# Patient Record
Sex: Female | Born: 1975 | Race: White | Hispanic: No | Marital: Married | Smoking: Never smoker
Health system: Southern US, Community
[De-identification: ages and names within clinical notes are randomized; demographics above are authoritative.]

## PROBLEM LIST (undated history)

## (undated) DIAGNOSIS — F419 Anxiety disorder, unspecified: Secondary | ICD-10-CM

## (undated) DIAGNOSIS — T7840XA Allergy, unspecified, initial encounter: Secondary | ICD-10-CM

## (undated) DIAGNOSIS — F329 Major depressive disorder, single episode, unspecified: Secondary | ICD-10-CM

## (undated) DIAGNOSIS — F32A Depression, unspecified: Secondary | ICD-10-CM

## (undated) HISTORY — DX: Anxiety disorder, unspecified: F41.9

## (undated) HISTORY — DX: Allergy, unspecified, initial encounter: T78.40XA

## (undated) HISTORY — DX: Depression, unspecified: F32.A

## (undated) HISTORY — DX: Major depressive disorder, single episode, unspecified: F32.9

---

## 2004-12-27 ENCOUNTER — Other Ambulatory Visit: Admission: RE | Admit: 2004-12-27 | Discharge: 2004-12-27 | Payer: Self-pay | Admitting: Family Medicine

## 2005-06-24 ENCOUNTER — Other Ambulatory Visit: Admission: RE | Admit: 2005-06-24 | Discharge: 2005-06-24 | Payer: Self-pay | Admitting: Family Medicine

## 2005-11-04 ENCOUNTER — Other Ambulatory Visit: Admission: RE | Admit: 2005-11-04 | Discharge: 2005-11-04 | Payer: Self-pay | Admitting: Obstetrics and Gynecology

## 2006-04-01 ENCOUNTER — Inpatient Hospital Stay (HOSPITAL_COMMUNITY): Admission: AD | Admit: 2006-04-01 | Discharge: 2006-04-01 | Payer: Self-pay | Admitting: Obstetrics and Gynecology

## 2006-04-05 ENCOUNTER — Inpatient Hospital Stay (HOSPITAL_COMMUNITY): Admission: AD | Admit: 2006-04-05 | Discharge: 2006-04-05 | Payer: Self-pay | Admitting: Obstetrics and Gynecology

## 2006-05-10 ENCOUNTER — Inpatient Hospital Stay (HOSPITAL_COMMUNITY): Admission: AD | Admit: 2006-05-10 | Discharge: 2006-05-13 | Payer: Self-pay | Admitting: Obstetrics and Gynecology

## 2007-02-23 IMAGING — US US OB COMP +14 WK
1 series · 14 of 28 positions shown · non-contrast
Comparison: None.

CLINICAL DATA: Maternal hypertension.
 TRANSVAGINAL OBSTETRICAL US:
TECHNIQUE: Transvaginal ultrasound was performed for evaluation of the gestation as well as the maternal uterus and adnexal regions.

[Series 1: us ob comp +14 wk · 0.35mm/px · 14 of 32 slices shown]
[im 2/32]
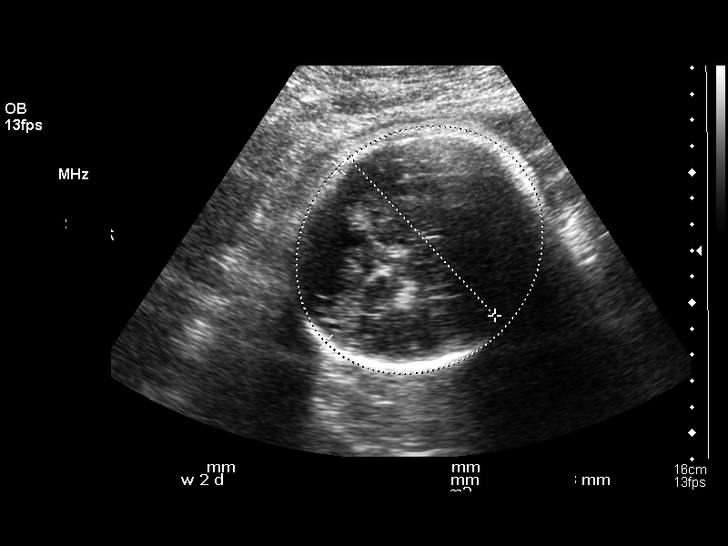
[im 4/32]
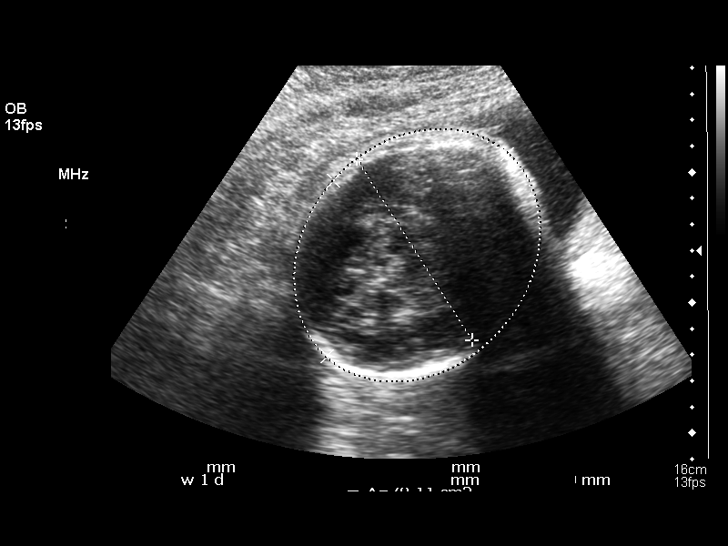
[im 6/32]
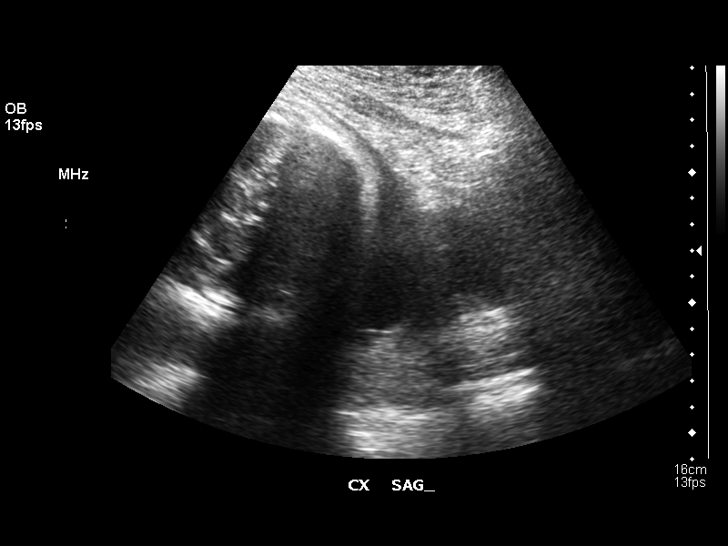
[im 9/32]
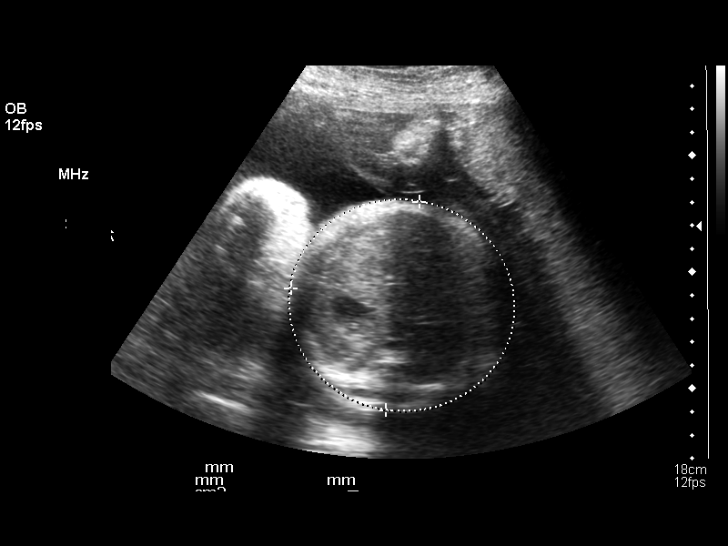
[im 11/32]
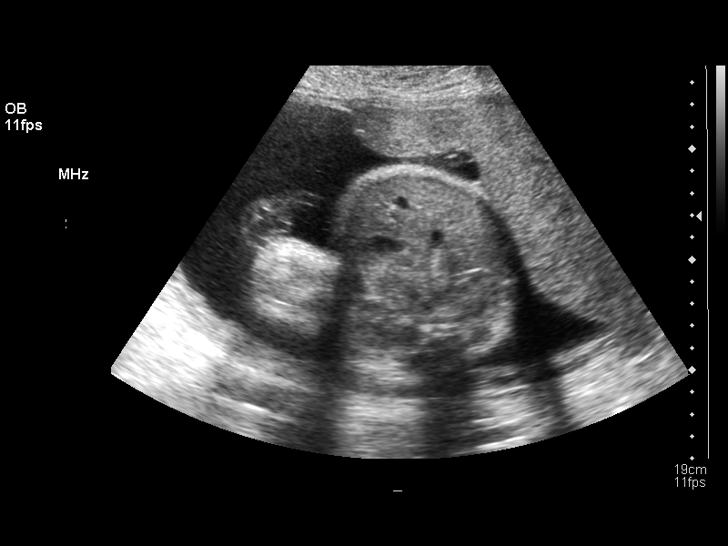
[im 13/32]
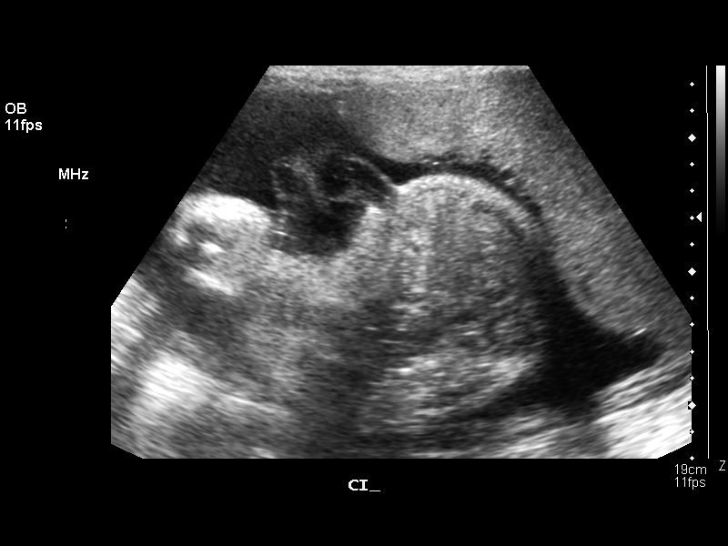
[im 15/32]
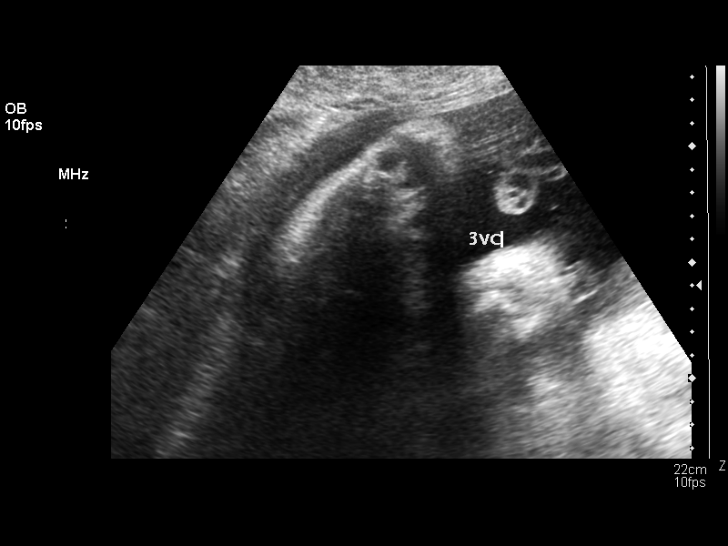
[im 18/32]
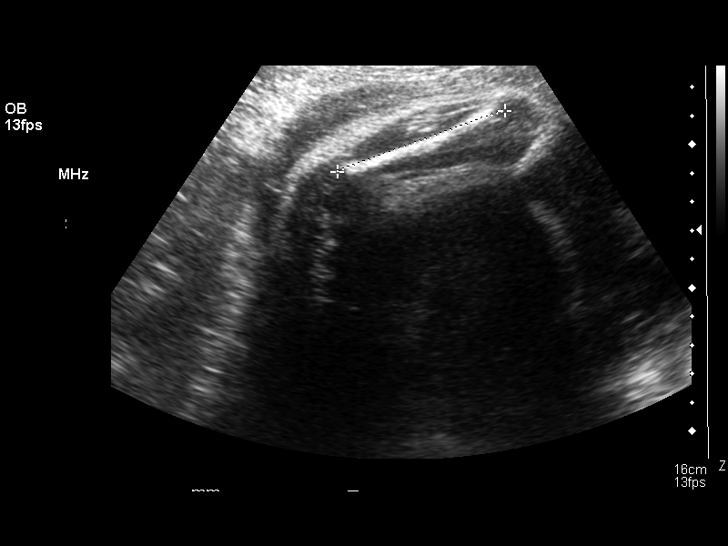
[im 20/32]
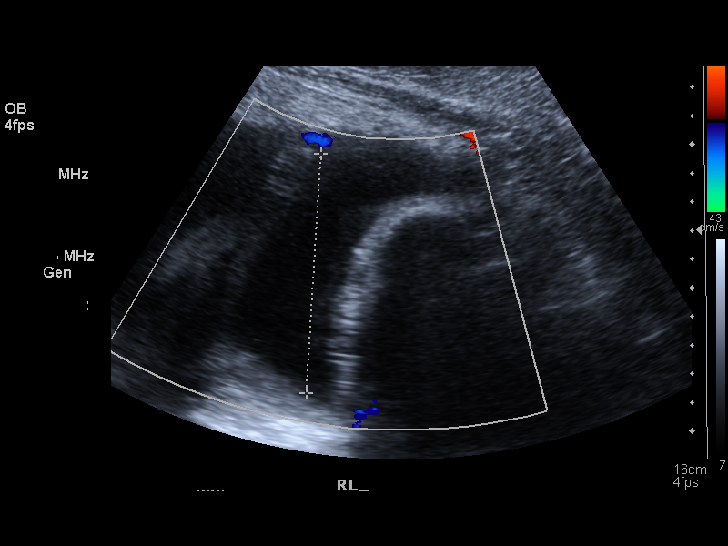
[im 22/32]
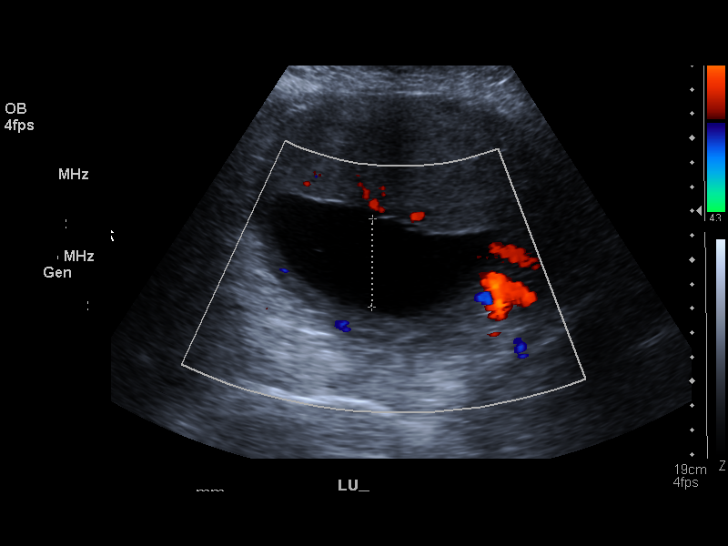
[im 25/32]
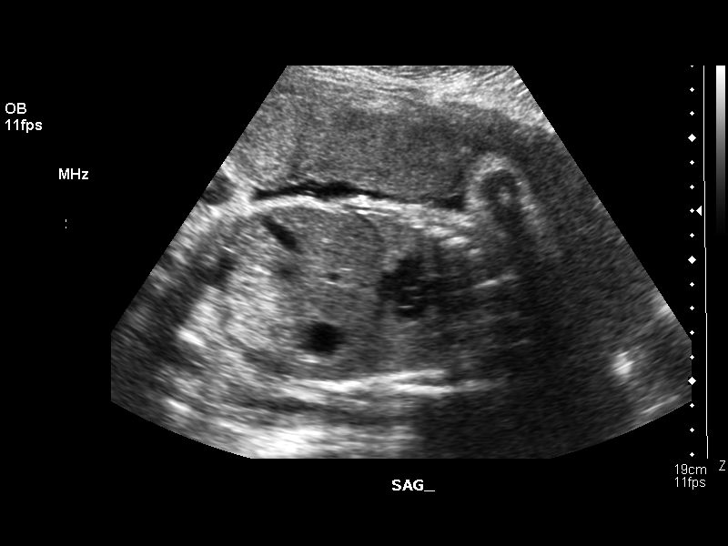
[im 27/32]
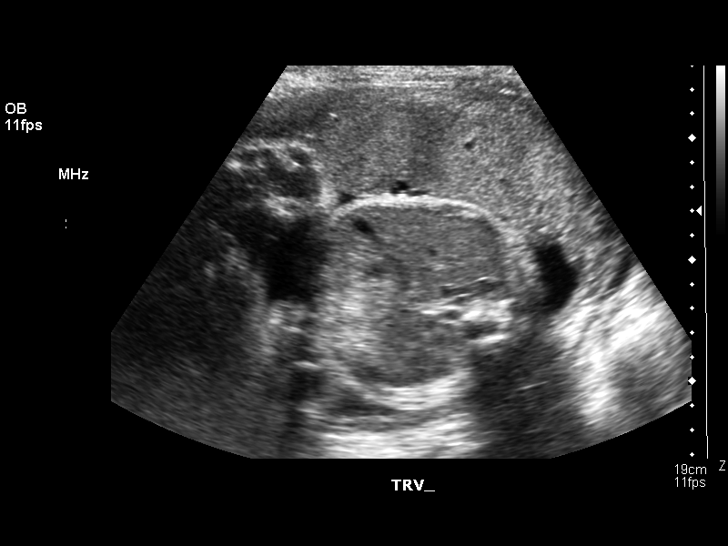
[im 29/32]
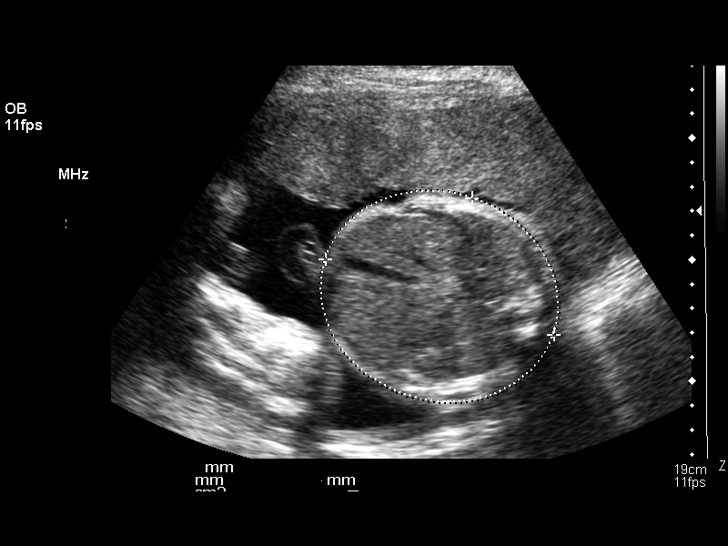
[im 32/32]
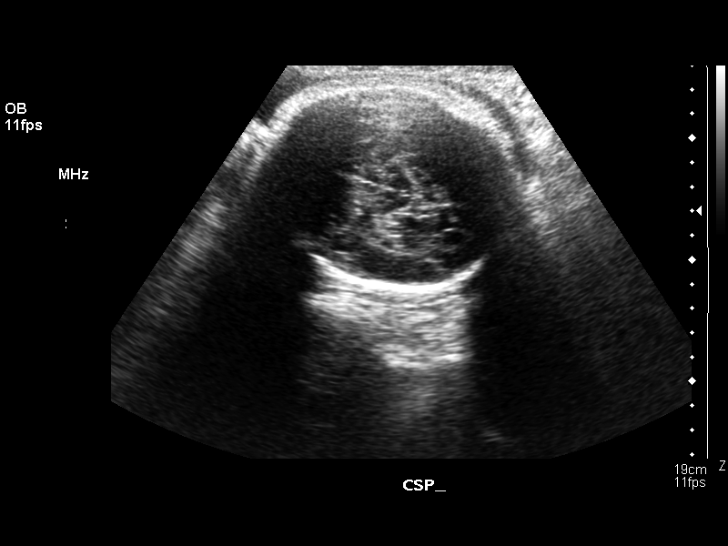

[14 of 28 positions shown; findings below may reference images not displayed]

FINDINGS: There is a single fetus which is currently cephalic in presentation.  The placenta is anterior and grade I.  There is no previa or abruption.  Amniotic fluid is subjectively normal with an index of 20 cm.  Gestational age is estimated at 33 weeks 0 days.  Fetal weight is estimated at 7361 grams.  Fetal assessment is limited by maternal body habitus.  There are no obvious anomalies.  See obstetrical worksheet for details.  The cervix measures 3.1 cm measured transabdominally.
IMPRESSION: 1.  Single, living, intrauterine fetus with estimated gestational age of 33 weeks 0 days.
 2.  Fetal assessment unremarkable given the limitations of the study. 
 3.  Placenta and amniotic fluid normal.

## 2011-10-19 ENCOUNTER — Ambulatory Visit: Payer: BC Managed Care – PPO | Admitting: Family Medicine

## 2011-10-19 VITALS — BP 143/80 | HR 97 | Temp 98.5°F | Resp 16

## 2011-10-19 DIAGNOSIS — J019 Acute sinusitis, unspecified: Secondary | ICD-10-CM

## 2011-10-19 MED ORDER — AMOXICILLIN 875 MG PO TABS: 875.0000 mg | ORAL_TABLET | Freq: Two times a day (BID) | ORAL | Status: AC

## 2011-10-19 NOTE — Progress Notes (Signed)
  Subjective:    Patient ID: Chelsea Wilkinson, female    DOB: [birth year], 36 y.o.   MRN: [id number]  HPI 36 yo female with URI symptoms for 4 days.  Sore throat, Tmax 100.6.  Head congestion/sinus pressure.  Lots of PND.  Sore throat is worst symptom.  Tender lymph nodes.  Did have strep throat last year but doesn't feel like that.    Review of Systems    Negative except as per HPI  Objective:   Physical Exam  Constitutional: She appears well-developed. No distress.  HENT:  Right Ear: Tympanic membrane, external ear and ear canal normal. Tympanic membrane is not injected, not scarred, not perforated, not erythematous, not retracted and not bulging.  Left Ear: Tympanic membrane, external ear and ear canal normal. Tympanic membrane is not injected, not scarred, not perforated, not erythematous, not retracted and not bulging.  Nose: Mucosal edema present. No rhinorrhea. Right sinus exhibits maxillary sinus tenderness. Right sinus exhibits no frontal sinus tenderness. Left sinus exhibits no maxillary sinus tenderness and no frontal sinus tenderness.  Mouth/Throat: Uvula is midline and mucous membranes are normal. Posterior oropharyngeal erythema present. No oropharyngeal exudate or tonsillar abscesses.  Cardiovascular: Normal rate, regular rhythm, normal heart sounds and intact distal pulses.   No murmur heard. Pulmonary/Chest: Effort normal and breath sounds normal. No respiratory distress. She has no wheezes. She has no rales.  Lymphadenopathy:       Head (right side): No submandibular and no preauricular adenopathy present.       Head (left side): No submandibular and no preauricular adenopathy present.       Right cervical: No superficial cervical and no posterior cervical adenopathy present.      Left cervical: No superficial cervical and no posterior cervical adenopathy present.       Right: No supraclavicular adenopathy present.       Left: No supraclavicular adenopathy present.  Skin:  Skin is warm and dry.          Assessment & Plan:  Sinusitis - Amox 875 BID

## 2011-11-05 ENCOUNTER — Telehealth: Payer: Self-pay

## 2011-11-05 NOTE — Telephone Encounter (Signed)
Patients husband request to pick up a copy of patients physical.

## 2011-11-05 NOTE — Telephone Encounter (Signed)
Printed out message due to patient's cpe is in her paper chart.

## 2013-02-23 ENCOUNTER — Ambulatory Visit (INDEPENDENT_AMBULATORY_CARE_PROVIDER_SITE_OTHER): Payer: BC Managed Care – PPO | Admitting: Physician Assistant

## 2013-02-23 VITALS — BP 118/82 | HR 98 | Temp 97.9°F | Resp 18 | Ht 63.0 in | Wt 231.6 lb

## 2013-02-23 DIAGNOSIS — R350 Frequency of micturition: Secondary | ICD-10-CM

## 2013-02-23 DIAGNOSIS — N390 Urinary tract infection, site not specified: Secondary | ICD-10-CM

## 2013-02-23 LAB — POCT UA - MICROSCOPIC ONLY
Casts, Ur, LPF, POC: NEGATIVE
Crystals, Ur, HPF, POC: NEGATIVE
RBC, urine, microscopic: NEGATIVE
Yeast, UA: NEGATIVE

## 2013-02-23 LAB — POCT URINALYSIS DIPSTICK
Ketones, UA: NEGATIVE
Nitrite, UA: NEGATIVE
Protein, UA: NEGATIVE
Urobilinogen, UA: 0.2
pH, UA: 5.5

## 2013-02-23 MED ORDER — NITROFURANTOIN MONOHYD MACRO 100 MG PO CAPS: 100.0000 mg | ORAL_CAPSULE | Freq: Two times a day (BID) | ORAL | Status: DC

## 2013-02-23 NOTE — Progress Notes (Signed)
Subjective:    Patient ID: Chelsea Wilkinson, female    DOB: 1976-01-22, 37 y.o.   MRN: [id number]  HPI   Ms. Delatte is a very pleasant 37 yr old female here with concern for urinary frequency and urgency.   Feels like she always use the bathroom frequently, but has been worse in last couple of days.  Denies dysuria or hematuria.  Notes "white flakes" in her urine - has never had this before and this is what prompted her to come in.  Has had a UTI in the past - thinks maybe 10-15 yrs ago.  At that time recalls having more pain and urgency.  Does endorse a little back pain today.  No abd pain, NV or FC.  Denies vaginal symptoms.  No concern for STIs.  LMP 02/04/13   Review of Systems  Constitutional: Negative for fever, chills and appetite change.  HENT: Negative.   Respiratory: Negative.   Cardiovascular: Negative.   Gastrointestinal: Negative for nausea, vomiting and abdominal pain.  Genitourinary: Positive for urgency and frequency. Negative for dysuria, hematuria, flank pain, vaginal discharge and menstrual problem.  Musculoskeletal: Positive for back pain.  Skin: Negative.   Neurological: Negative.        Objective:   Physical Exam  Vitals reviewed. Constitutional: She is oriented to person, place, and time. She appears well-developed and well-nourished. No distress.  HENT:  Head: Normocephalic and atraumatic.  Eyes: Conjunctivae are normal. No scleral icterus.  Cardiovascular: Normal rate, regular rhythm and normal heart sounds.   Pulmonary/Chest: Effort normal and breath sounds normal. She has no wheezes. She has no rales.  Abdominal: Soft. There is tenderness in the right lower quadrant, periumbilical area and suprapubic area. There is CVA tenderness (slight, right sided). There is no rigidity, no rebound and no guarding.  Neurological: She is alert and oriented to person, place, and time.  Skin: Skin is warm and dry.  Psychiatric: She has a normal mood and affect. Her behavior  is normal.     Results for orders placed in visit on 02/23/13  POCT UA - MICROSCOPIC ONLY      Result Value Range   WBC, Ur, HPF, POC 0-4     RBC, urine, microscopic neg     Bacteria, U Microscopic trace     Mucus, UA neg     Epithelial cells, urine per micros 0-6     Crystals, Ur, HPF, POC neg     Casts, Ur, LPF, POC neg     Yeast, UA neg    POCT URINALYSIS DIPSTICK      Result Value Range   Color, UA yellow     Clarity, UA clear     Glucose, UA neg     Bilirubin, UA neg     Ketones, UA neg     Spec Grav, UA 1.010     Blood, UA neg     pH, UA 5.5     Protein, UA neg     Urobilinogen, UA 0.2     Nitrite, UA neg     Leukocytes, UA small (1+)          Assessment & Plan:  UTI (urinary tract infection) - Plan: Urine culture, nitrofurantoin, macrocrystal-monohydrate, (MACROBID) 100 MG capsule  Frequency of urination - Plan: POCT UA - Microscopic Only, POCT urinalysis dipstick, Urine culture, nitrofurantoin, macrocrystal-monohydrate, (MACROBID) 100 MG capsule   Ms. Dziuba is a very pleasant 37 yr old female here with likely UTI.  UA  is not overly impressive but does have leuks and bacteria.  Will send culture to confirm.  Will start macrobid today.  Push fluids.  Discussed RTC precautions with pt who understands and is in agreement with this plan.  Will adjust abx if necessary based on cx data.

## 2013-02-23 NOTE — Patient Instructions (Addendum)
Begin taking the Macrobid (nitrofurantoin) as directed.  Drink plenty of fluids (water is best!)  I am sending your urine out for culture and will let you know what this shows and if we need to make any changes to treatment.  Please let us know if symptoms worsen or do not improve.   Urinary Tract Infection Urinary tract infections (UTIs) can develop anywhere along your urinary tract. Your urinary tract is your body's drainage system for removing wastes and extra water. Your urinary tract includes two kidneys, two ureters, a bladder, and a urethra. Your kidneys are a pair of bean-shaped organs. Each kidney is about the size of your fist. They are located below your ribs, one on each side of your spine. CAUSES Infections are caused by microbes, which are microscopic organisms, including fungi, viruses, and bacteria. These organisms are so small that they can only be seen through a microscope. Bacteria are the microbes that most commonly cause UTIs. SYMPTOMS  Symptoms of UTIs may vary by age and gender of the patient and by the location of the infection. Symptoms in young women typically include a frequent and intense urge to urinate and a painful, burning feeling in the bladder or urethra during urination. Older women and men are more likely to be tired, shaky, and weak and have muscle aches and abdominal pain. A fever may mean the infection is in your kidneys. Other symptoms of a kidney infection include pain in your back or sides below the ribs, nausea, and vomiting. DIAGNOSIS To diagnose a UTI, your caregiver will ask you about your symptoms. Your caregiver also will ask to provide a urine sample. The urine sample will be tested for bacteria and white blood cells. White blood cells are made by your body to help fight infection. TREATMENT  Typically, UTIs can be treated with medication. Because most UTIs are caused by a bacterial infection, they usually can be treated with the use of antibiotics. The  choice of antibiotic and length of treatment depend on your symptoms and the type of bacteria causing your infection. HOME CARE INSTRUCTIONS  If you were prescribed antibiotics, take them exactly as your caregiver instructs you. Finish the medication even if you feel better after you have only taken some of the medication.  Drink enough water and fluids to keep your urine clear or pale yellow.  Avoid caffeine, tea, and carbonated beverages. They tend to irritate your bladder.  Empty your bladder often. Avoid holding urine for long periods of time.  Empty your bladder before and after sexual intercourse.  After a bowel movement, women should cleanse from front to back. Use each tissue only once. SEEK MEDICAL CARE IF:   You have back pain.  You develop a fever.  Your symptoms do not begin to resolve within 3 days. SEEK IMMEDIATE MEDICAL CARE IF:   You have severe back pain or lower abdominal pain.  You develop chills.  You have nausea or vomiting.  You have continued burning or discomfort with urination. MAKE SURE YOU:   Understand these instructions.  Will watch your condition.  Will get help right away if you are not doing well or get worse. Document Released: 04/23/2005 Document Revised: 01/13/2012 Document Reviewed: 08/22/2011 [facility] Patient Information 2014 [place], [place].

## 2013-02-25 LAB — URINE CULTURE: Colony Count: 30000

## 2013-03-09 ENCOUNTER — Ambulatory Visit: Payer: 59 | Admitting: Family Medicine

## 2013-03-09 VITALS — BP 122/80 | HR 67 | Temp 98.0°F | Resp 16 | Ht 63.0 in | Wt 230.0 lb

## 2013-03-09 DIAGNOSIS — IMO0001 Reserved for inherently not codable concepts without codable children: Secondary | ICD-10-CM

## 2013-03-09 DIAGNOSIS — R350 Frequency of micturition: Secondary | ICD-10-CM

## 2013-03-09 LAB — POCT WET PREP WITH KOH
Clue Cells Wet Prep HPF POC: NEGATIVE
KOH Prep POC: NEGATIVE
RBC Wet Prep HPF POC: NEGATIVE
Trichomonas, UA: NEGATIVE
Yeast Wet Prep HPF POC: NEGATIVE

## 2013-03-09 LAB — POCT URINALYSIS DIPSTICK
Bilirubin, UA: NEGATIVE
Blood, UA: NEGATIVE
Glucose, UA: NEGATIVE
Nitrite, UA: NEGATIVE
pH, UA: 7

## 2013-03-09 LAB — POCT UA - MICROSCOPIC ONLY
Bacteria, U Microscopic: NEGATIVE
Crystals, Ur, HPF, POC: NEGATIVE
Mucus, UA: NEGATIVE

## 2013-03-09 NOTE — Progress Notes (Signed)
37 yo woman with urinary frequency and some white flecks in the urine.  Also has some soreness in right flank and lower back. Associated:  fatigue No fever, dysuria, vaginal symptoms Last pelvic exam:  2013 She  Works as a Social worker  Objective:  NAD Abdomen:  Soft, tender in RUQ and LUQ without HSM, no masses or CVAT Pelvic exam:  Normal introitus, vagina and cervix;  Normal bimanual Results for orders placed in visit on 03/09/13  POCT URINALYSIS DIPSTICK      Result Value Range   Color, UA yellow     Clarity, UA clear     Glucose, UA neg     Bilirubin, UA neg     Ketones, UA eng     Spec Grav, UA 1.010     Blood, UA neg     pH, UA 7.0     Protein, UA neg     Urobilinogen, UA 0.2     Nitrite, UA neg     Leukocytes, UA Negative    POCT UA - MICROSCOPIC ONLY      Result Value Range   WBC, Ur, HPF, POC 0-1     RBC, urine, microscopic 0-1     Bacteria, U Microscopic neg     Mucus, UA neg     Epithelial cells, urine per micros 0-1     Crystals, Ur, HPF, POC neg     Casts, Ur, LPF, POC neg     Yeast, UA neg    ' Assessment: Patient is urinary frequency without a definite cause. I explained that this may just be transient in nature and not something to worry about. The white flecks in the urine a hard to explain. I looked at the urine didn't see much but I explained that it's possible that this is protein.  Plan: No treatment at present, if new symptoms develop patient can come back and we can look into this further  Signed, Elvina Sidle

## 2013-09-21 ENCOUNTER — Encounter: Payer: 59 | Admitting: Internal Medicine

## 2013-11-15 ENCOUNTER — Encounter: Payer: 59 | Admitting: Physician Assistant

## 2015-01-28 ENCOUNTER — Ambulatory Visit (INDEPENDENT_AMBULATORY_CARE_PROVIDER_SITE_OTHER): Payer: 59 | Admitting: Internal Medicine

## 2015-01-28 VITALS — BP 118/70 | HR 84 | Temp 98.5°F | Resp 18 | Ht 63.0 in | Wt 239.2 lb

## 2015-01-28 DIAGNOSIS — H66002 Acute suppurative otitis media without spontaneous rupture of ear drum, left ear: Secondary | ICD-10-CM

## 2015-01-28 MED ORDER — AMOXICILLIN 500 MG PO CAPS
1000.0000 mg | ORAL_CAPSULE | Freq: Two times a day (BID) | ORAL | Status: AC
Start: 2015-01-28 — End: 2015-02-07

## 2015-01-28 NOTE — Progress Notes (Signed)
   Subjective:  This chart was scribed for Ellamae Siaobert Norabelle Kondo, MD by [facility] Inc[facility]adim Abu Hashem, medical scribe at [facility].The patient was seen in exam room 01 and the patient's care was started at 9:56 AM.   Patient ID: Chelsea Wilkinson, female    DOB: 12/08/1975, 39 y.o.   MRN: [id number] Chief Complaint  Patient presents with  . Otitis Media    C/O ear stopped up & sharp pains off & on, & crackling noise since yesterday (right ear). No drainage   HPI HPI Comments: Chelsea Wilkinson is a 39 y.o. female who presents to [facility] complaining of left ear pain with associated hearing loss, onset yesterday. This past week she has been battling a cough and sinus congestion. She did take four ibuprofen last night. she gets a sinus infection every few months. She denies fever and chills.  Review of Systems  Constitutional: Negative for fever and chills.  HENT: Positive for congestion, ear pain and mouth sores.   Respiratory: Positive for cough.       Objective:  BP 118/70 mmHg  Pulse 84  Temp(Src) 98.5 F (36.9 C) (Oral)  Resp 18  Ht 5\' 3"  (1.6 m)  Wt 239 lb 4 oz (108.523 kg)  BMI 42.39 kg/m2  SpO2 98%  LMP 01/06/2015 (Exact Date) Physical Exam  Constitutional: She is oriented to person, place, and time. She appears well-developed and well-nourished. No distress.  HENT:  Head: Normocephalic and atraumatic.  Right Ear: External ear normal.  Nose: Nose normal.  Mouth/Throat: Oropharynx is clear and moist.  The left tympanic membrane is red and bulging with bullae formation//pustular  Eyes: Conjunctivae are normal. Pupils are equal, round, and reactive to light.  Neck: Normal range of motion.  Cardiovascular: Normal rate.   Pulmonary/Chest: Effort normal. No respiratory distress.  Lymphadenopathy:    She has no cervical adenopathy.  Neurological: She is alert and oriented to person, place, and time.  Skin: Skin is warm and dry. No rash noted.    Psychiatric: She has a normal mood and affect. Her behavior is normal.  Nursing note and vitals reviewed.     Assessment & Plan:  Acute suppurative otitis media of left ear without spontaneous rupture of tympanic membrane, recurrence not specified Meds ordered this encounter  Medications  . amoxicillin (AMOXIL) 500 MG capsule    Sig: Take 2 capsules (1,000 mg total) by mouth 2 (two) times daily.    Dispense:  40 capsule    Refill:  0   Sudafed if desired Recheck in 10-14 days if hearing not fully restored  I have completed the patient encounter in its entirety as documented by the scribe, with editing by me where necessary. Jenniah Bhavsar P. Merla Richesoolittle, M.D.
